# Patient Record
Sex: Female | Born: 1975 | Race: Asian | Hispanic: No | Marital: Single | Smoking: Never smoker
Health system: Southern US, Community
[De-identification: ages and names within clinical notes are randomized; demographics above are authoritative.]

## PROBLEM LIST (undated history)

## (undated) DIAGNOSIS — G43909 Migraine, unspecified, not intractable, without status migrainosus: Secondary | ICD-10-CM

## (undated) HISTORY — DX: Migraine, unspecified, not intractable, without status migrainosus: G43.909

---

## 2006-03-19 ENCOUNTER — Ambulatory Visit (HOSPITAL_COMMUNITY): Admission: RE | Admit: 2006-03-19 | Discharge: 2006-03-19 | Payer: Self-pay | Admitting: Obstetrics & Gynecology

## 2006-05-23 ENCOUNTER — Ambulatory Visit: Payer: Self-pay | Admitting: Gynecology

## 2006-07-13 ENCOUNTER — Inpatient Hospital Stay (HOSPITAL_COMMUNITY): Admission: AD | Admit: 2006-07-13 | Discharge: 2006-07-15 | Payer: Self-pay | Admitting: Obstetrics & Gynecology

## 2006-07-30 ENCOUNTER — Emergency Department (HOSPITAL_COMMUNITY): Admission: EM | Admit: 2006-07-30 | Discharge: 2006-07-30 | Payer: Self-pay | Admitting: *Deleted

## 2006-07-31 ENCOUNTER — Ambulatory Visit: Payer: Self-pay | Admitting: Vascular Surgery

## 2006-07-31 ENCOUNTER — Ambulatory Visit (HOSPITAL_COMMUNITY): Admission: RE | Admit: 2006-07-31 | Discharge: 2006-07-31 | Payer: Self-pay | Admitting: *Deleted

## 2010-01-06 ENCOUNTER — Emergency Department (HOSPITAL_COMMUNITY): Admission: EM | Admit: 2010-01-06 | Discharge: 2010-01-06 | Payer: Self-pay | Admitting: Emergency Medicine

## 2010-08-02 ENCOUNTER — Other Ambulatory Visit: Payer: Self-pay | Admitting: Family Medicine

## 2010-08-02 DIAGNOSIS — Z3689 Encounter for other specified antenatal screening: Secondary | ICD-10-CM

## 2010-08-07 ENCOUNTER — Encounter (HOSPITAL_COMMUNITY): Payer: Self-pay

## 2010-08-07 ENCOUNTER — Ambulatory Visit (HOSPITAL_COMMUNITY)
Admission: RE | Admit: 2010-08-07 | Discharge: 2010-08-07 | Disposition: A | Discharge status: Home / Self Care | Payer: Medicaid Other | Source: Ambulatory Visit | Attending: Family Medicine | Admitting: Family Medicine

## 2010-08-07 DIAGNOSIS — O358XX0 Maternal care for other (suspected) fetal abnormality and damage, not applicable or unspecified: Secondary | ICD-10-CM | POA: Insufficient documentation

## 2010-08-07 DIAGNOSIS — Z3689 Encounter for other specified antenatal screening: Secondary | ICD-10-CM

## 2010-08-07 DIAGNOSIS — O0930 Supervision of pregnancy with insufficient antenatal care, unspecified trimester: Secondary | ICD-10-CM | POA: Insufficient documentation

## 2010-08-07 DIAGNOSIS — Z1389 Encounter for screening for other disorder: Secondary | ICD-10-CM | POA: Insufficient documentation

## 2010-08-07 DIAGNOSIS — Z363 Encounter for antenatal screening for malformations: Secondary | ICD-10-CM | POA: Insufficient documentation

## 2010-09-02 LAB — POCT I-STAT, CHEM 8
BUN: 13 mg/dL (ref 6–23)
Calcium, Ion: 1.1 mmol/L — ABNORMAL LOW (ref 1.12–1.32)
Creatinine, Ser: 0.8 mg/dL (ref 0.4–1.2)
Glucose, Bld: 80 mg/dL (ref 70–99)
TCO2: 27 mmol/L (ref 0–100)

## 2010-11-23 ENCOUNTER — Inpatient Hospital Stay (HOSPITAL_COMMUNITY)
Admission: AD | Admit: 2010-11-23 | Discharge: 2010-11-25 | DRG: 775 | Disposition: A | Discharge status: Home / Self Care | Payer: 59 | Source: Ambulatory Visit | Attending: Obstetrics & Gynecology | Admitting: Obstetrics & Gynecology

## 2010-11-23 LAB — CBC
MCH: 27.7 pg (ref 26.0–34.0)
MCV: 88.4 fL (ref 78.0–100.0)
Platelets: 232 10*3/uL (ref 150–400)
RBC: 3.79 MIL/uL — ABNORMAL LOW (ref 3.87–5.11)
RDW: 15.3 % (ref 11.5–15.5)

## 2011-03-03 ENCOUNTER — Ambulatory Visit (INDEPENDENT_AMBULATORY_CARE_PROVIDER_SITE_OTHER): Payer: Medicaid Other

## 2011-03-03 ENCOUNTER — Inpatient Hospital Stay (INDEPENDENT_AMBULATORY_CARE_PROVIDER_SITE_OTHER)
Admission: RE | Admit: 2011-03-03 | Discharge: 2011-03-03 | Disposition: A | Discharge status: Home / Self Care | Payer: Medicaid Other | Source: Ambulatory Visit | Attending: Family Medicine | Admitting: Family Medicine

## 2011-03-03 DIAGNOSIS — K5900 Constipation, unspecified: Secondary | ICD-10-CM

## 2012-12-17 IMAGING — CR DG ABDOMEN 2V
2 series · 2 of 2 positions shown · non-contrast
Comparison: None

CLINICAL DATA: Abdominal pain and constipation for 3 months.  3
months postpartum.

ABDOMEN - 2 VIEW

[view not recorded (1 of 2)]
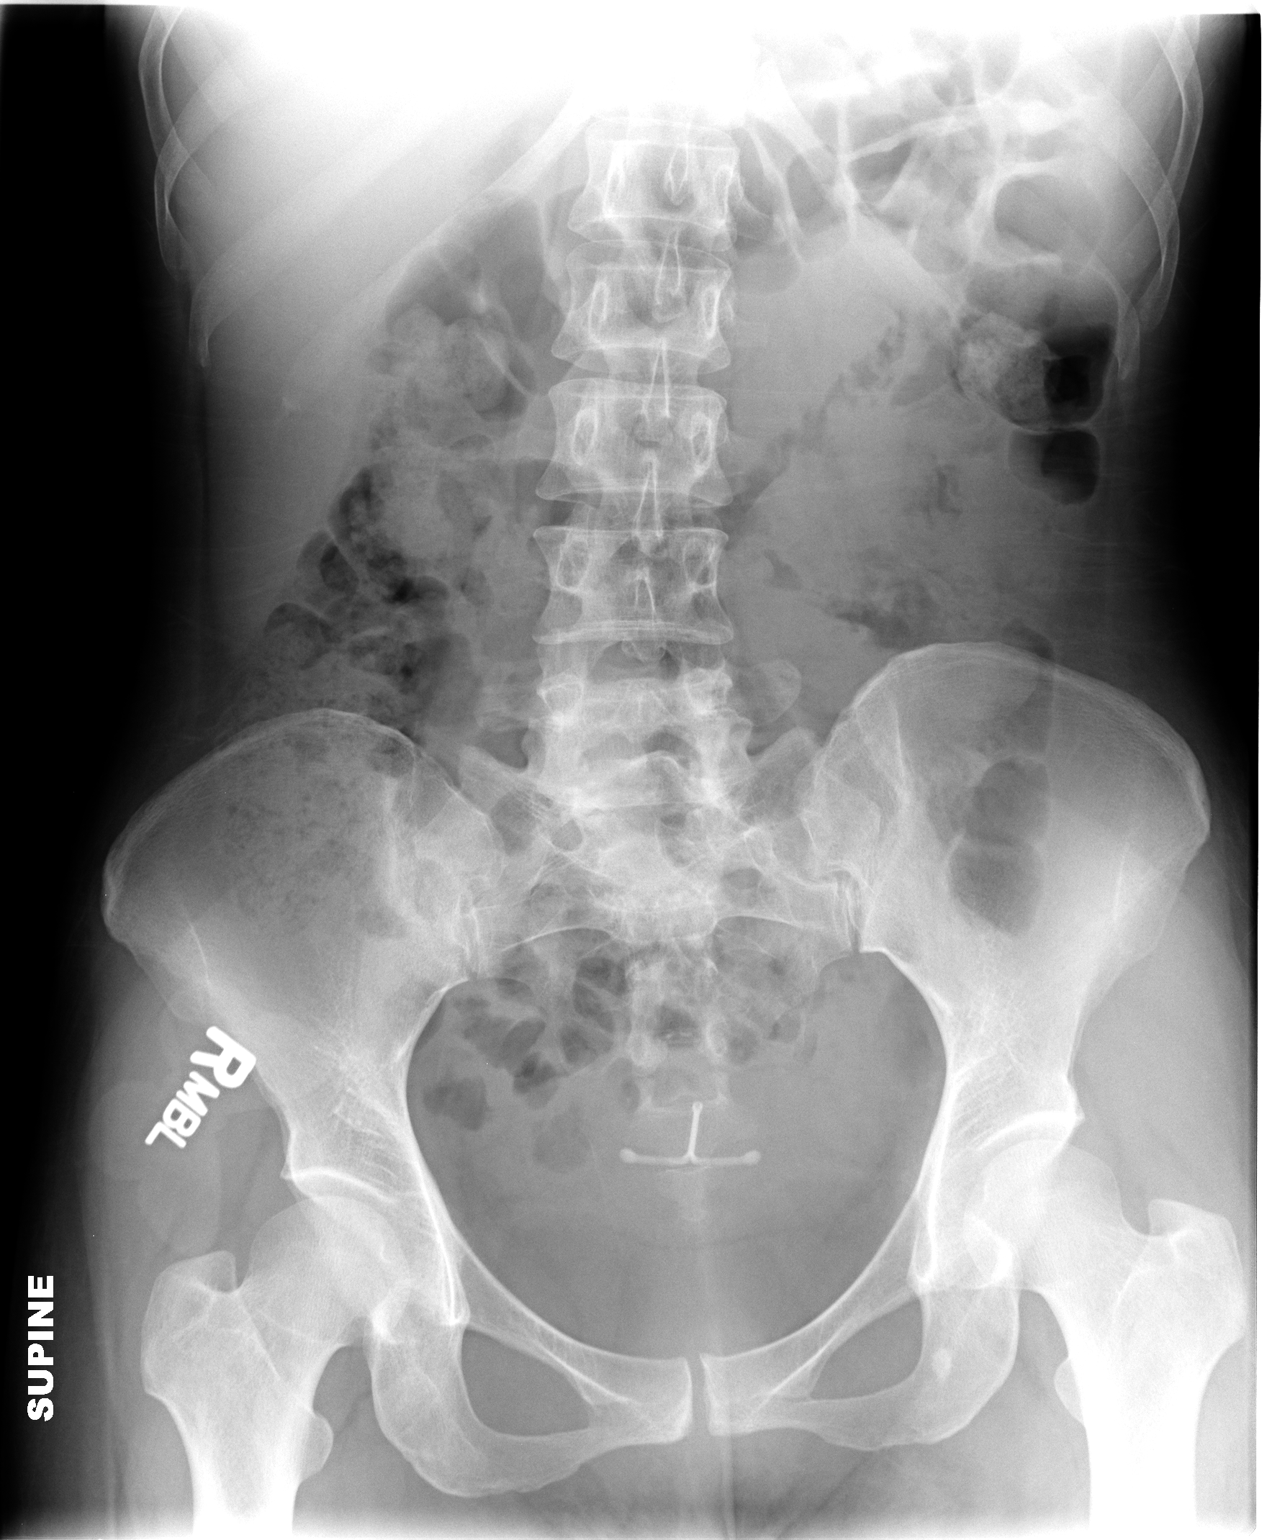

[view not recorded (2 of 2)]
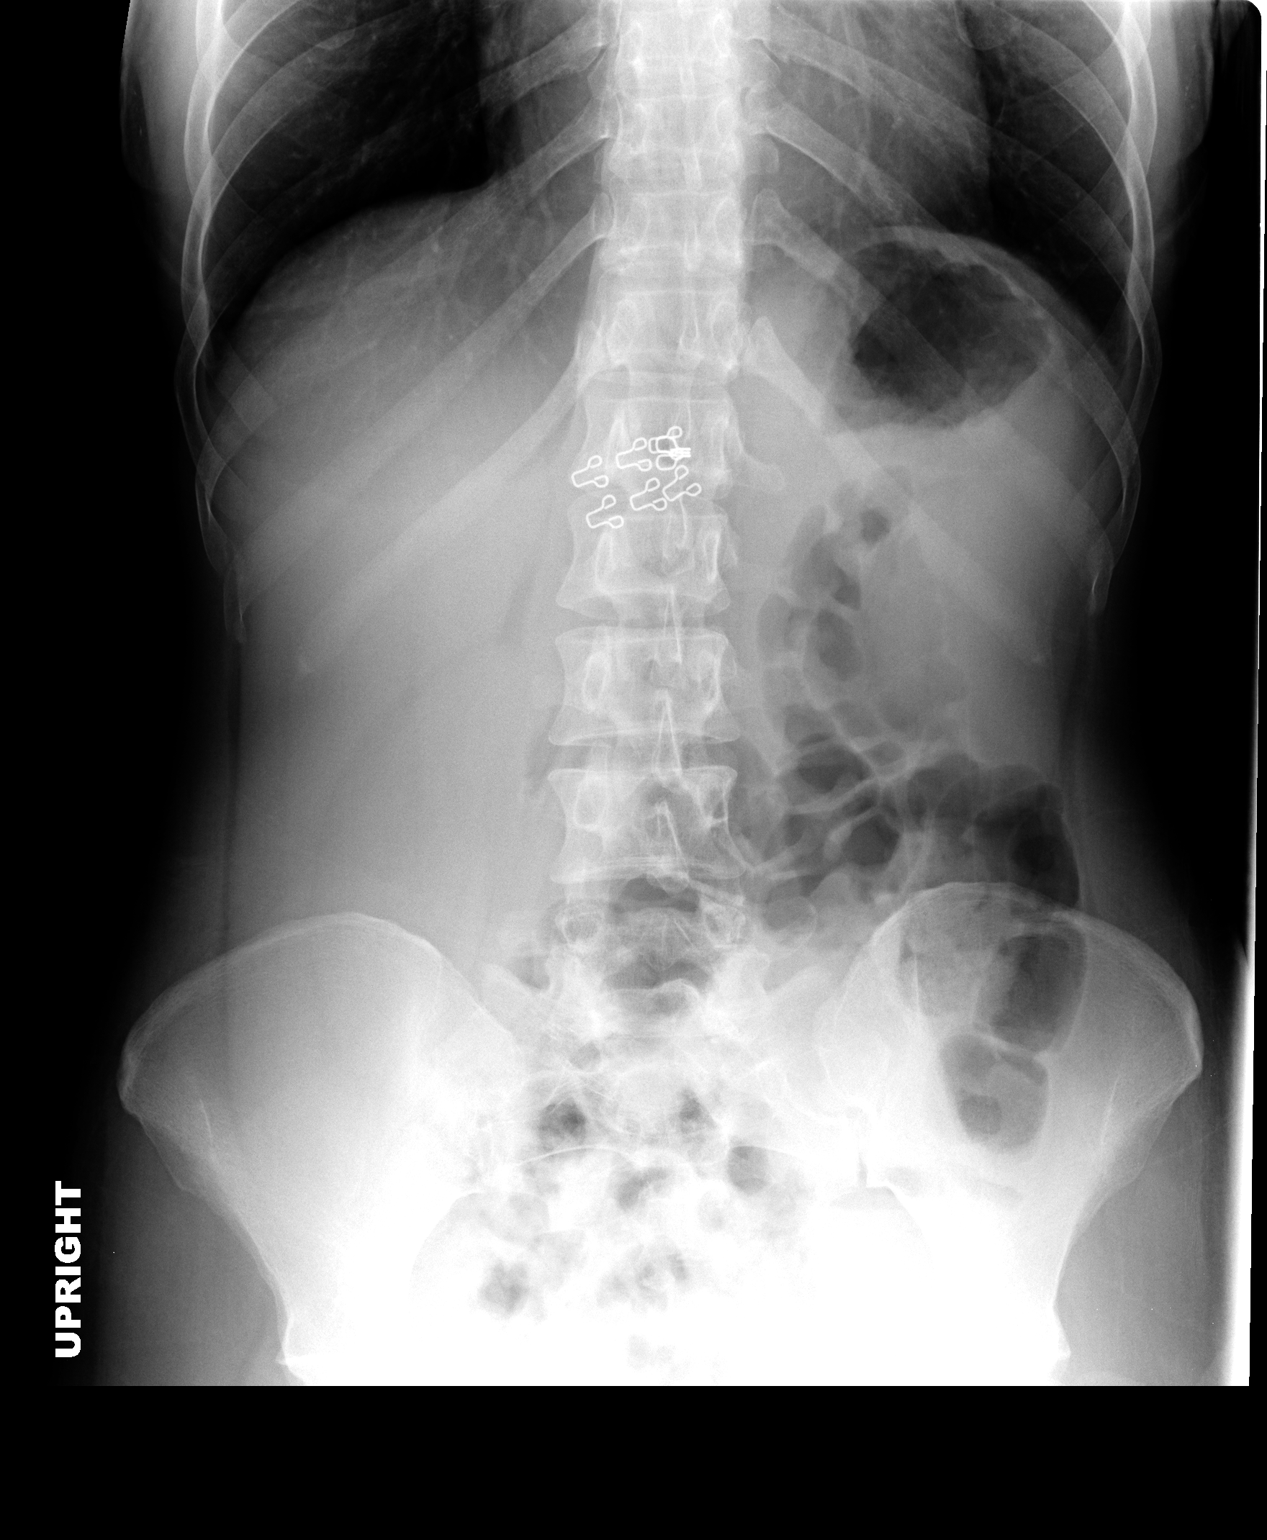

[2 of 2 positions shown; findings below may reference images not displayed]

FINDINGS: Bowel gas pattern is nonobstructive.  An intrauterine
device overlies the central pelvis.  No evidence for organomegaly
or abnormal calcifications. Visualized osseous structures have a
normal appearance.  Lung bases are clear.  No evidence for free
intraperitoneal air beneath the diaphragm. There is moderate stool
within the ascending colon, within nondilated loops.
IMPRESSION: 1.  Moderate stool burden.
2. No evidence for acute  abnormality.

## 2014-04-19 ENCOUNTER — Encounter (HOSPITAL_COMMUNITY): Payer: Self-pay

## 2015-03-31 DIAGNOSIS — Z598 Other problems related to housing and economic circumstances: Secondary | ICD-10-CM

## 2015-03-31 DIAGNOSIS — Z5989 Other problems related to housing and economic circumstances: Secondary | ICD-10-CM

## 2015-03-31 NOTE — Congregational Nurse Program (Signed)
Assisted patient in completing new Medicaid application.

## 2015-06-15 DIAGNOSIS — Z719 Counseling, unspecified: Secondary | ICD-10-CM

## 2015-06-16 NOTE — Congregational Nurse Program (Signed)
Congregational Nurse Program Note  Date of Encounter: 06/15/2015  Past Medical History: No past medical history on file.  Encounter Details:     CNP Questionnaire - 06/15/15 1713    Patient Demographics   Is this a new or existing patient? Existing   Patient is considered a/an Refugee   Race Asian   Patient Assistance   Location of Patient Assistance Not Applicable   Patient's financial/insurance status Low Income;Medicaid  Family planning Medicaid only   Uninsured Patient No   Patient referred to apply for the following financial assistance Not Applicable   Food insecurities addressed Not Applicable   Transportation assistance No   Assistance securing medications No   Educational health offerings Navigating the healthcare system   Encounter Details   Primary purpose of visit Navigating the Healthcare System   Was an Emergency Department visit averted? Not Applicable   Does patient have a medical provider? No   Patient referred to Establish PCP   Was a mental health screening completed? (GAINS tool) No   Does patient have dental issues? No   Since previous encounter, have you referred patient for abnormal blood pressure that resulted in a new diagnosis or medication change? No   Since previous encounter, have you referred patient for abnormal blood glucose that resulted in a new diagnosis or medication change? No   For Abstraction Use Only   Does patient have insurance? Yes       Patient received new Medicaid card but uncertain what type coverage she has.  CN called DSS and determined [facility]  [facility]  Patient has no medical home.  Message left [facility] to schedule [facility].

## 2015-06-22 ENCOUNTER — Telehealth: Payer: Self-pay

## 2015-06-22 NOTE — Telephone Encounter (Signed)
Called patient to tell her she has an appointment at [facility] [address] On Jan. 9, 2017 @ 9;30 for eligibility and needs to pick up a packet at the front desk before the appointment.  Also informed her that a request has been put in to DSS to change her Medicaid provider [facility].

## 2017-12-04 ENCOUNTER — Ambulatory Visit (INDEPENDENT_AMBULATORY_CARE_PROVIDER_SITE_OTHER): Payer: Medicaid Other | Admitting: Obstetrics & Gynecology

## 2017-12-04 ENCOUNTER — Encounter: Payer: Self-pay | Admitting: Obstetrics & Gynecology

## 2017-12-04 ENCOUNTER — Other Ambulatory Visit (HOSPITAL_COMMUNITY)
Admission: RE | Admit: 2017-12-04 | Discharge: 2017-12-04 | Disposition: A | Discharge status: Home / Self Care | Payer: Medicaid Other | Source: Ambulatory Visit | Attending: Obstetrics & Gynecology | Admitting: Obstetrics & Gynecology

## 2017-12-04 VITALS — BP 122/80 | HR 96 | Wt 106.1 lb

## 2017-12-04 DIAGNOSIS — Z0000 Encounter for general adult medical examination without abnormal findings: Secondary | ICD-10-CM | POA: Diagnosis not present

## 2017-12-04 DIAGNOSIS — Z23 Encounter for immunization: Secondary | ICD-10-CM | POA: Diagnosis not present

## 2017-12-04 DIAGNOSIS — Z01419 Encounter for gynecological examination (general) (routine) without abnormal findings: Secondary | ICD-10-CM | POA: Diagnosis present

## 2017-12-04 NOTE — Progress Notes (Signed)
[place] interpreter [name]  Mammogram scheduled for July 16th @ 1040.  Pt notified.  Tdap vaccine given

## 2017-12-04 NOTE — Progress Notes (Signed)
Subjective:    Pamela Anderson is a 42 y.o. divorced 4 (2922, 2718, 311, and 677 yo kids) female who presents for an annual exam. The patient has no complaints today. The patient is not currently sexually active. GYN screening history: last pap: was normal. The patient wears seatbelts: yes. The patient participates in regular exercise: yes. Has the patient ever been transfused or tattooed?: no. The patient reports that there is not domestic violence in her life.   Menstrual History: OB History    Gravida  4   Para  4   Term  4   Preterm      AB      Living  4     SAB      TAB      Ectopic      Multiple      Live Births              Menarche age: [age] No LMP recorded. (Menstrual status: IUD).    The following portions of the patient's history were reviewed and updated as appropriate: allergies, current medications, past family history, past medical history, past social history, past surgical history and problem list.  Review of Systems Pertinent items are noted in HPI.   Has had an IUD since 2017, placed at the health dept, doesn't have periods FH-no breast/gyn/colon cancer Live interpretor used for this visit Works at [employer]   Objective:    BP 122/80   Pulse 96   Wt 106 lb 1.6 oz (48.1 kg)   Breastfeeding? Unknown   General Appearance:    Alert, cooperative, no distress, appears stated age  Head:    Normocephalic, without obvious abnormality, atraumatic  Eyes:    PERRL, conjunctiva/corneas clear, EOM's intact, fundi    benign, both eyes  Ears:    Normal TM's and external ear canals, both ears  Nose:   Nares normal, septum midline, mucosa normal, no drainage    or sinus tenderness  Throat:   Lips, mucosa, and tongue normal; teeth and gums normal  Neck:   Supple, symmetrical, trachea midline, no adenopathy;    thyroid:  no enlargement/tenderness/nodules; no carotid   bruit or JVD  Back:     Symmetric, no curvature, ROM normal, no CVA tenderness  Lungs:     Clear to  auscultation bilaterally, respirations unlabored  Chest Wall:    No tenderness or deformity   Heart:    Regular rate and rhythm, S1 and S2 normal, no murmur, rub   or gallop  Breast Exam:    No tenderness, masses, or nipple abnormality  Abdomen:     Soft, non-tender, bowel sounds active all four quadrants,    no masses, no organomegaly  Genitalia:    Normal female without lesion, discharge or tenderness, normal size and shape, anteverted, mobile, non-tender, normal adnexal exam      Extremities:   Extremities normal, atraumatic, no cyanosis or edema  Pulses:   2+ and symmetric all extremities  Skin:   Skin color, texture, turgor normal, no rashes or lesions  Lymph nodes:   Cervical, supraclavicular, and axillary nodes normal  Neurologic:   CNII-XII intact, normal strength, sensation and reflexes    throughout  .    Assessment:    Healthy female exam.    Plan:     Mammogram. Thin prep Pap smear. with cotesting TDAP

## 2017-12-05 LAB — CYTOLOGY - PAP
Diagnosis: NEGATIVE
HPV: NOT DETECTED

## 2017-12-31 ENCOUNTER — Ambulatory Visit
Admission: RE | Admit: 2017-12-31 | Discharge: 2017-12-31 | Disposition: A | Discharge status: Home / Self Care | Payer: Medicaid Other | Source: Ambulatory Visit | Attending: Obstetrics & Gynecology | Admitting: Obstetrics & Gynecology

## 2017-12-31 ENCOUNTER — Other Ambulatory Visit (HOSPITAL_COMMUNITY): Payer: Self-pay | Admitting: Obstetrics & Gynecology

## 2017-12-31 ENCOUNTER — Other Ambulatory Visit: Payer: Self-pay | Admitting: Obstetrics & Gynecology

## 2017-12-31 DIAGNOSIS — Z1231 Encounter for screening mammogram for malignant neoplasm of breast: Secondary | ICD-10-CM

## 2017-12-31 DIAGNOSIS — Z01419 Encounter for gynecological examination (general) (routine) without abnormal findings: Secondary | ICD-10-CM

## 2018-05-22 ENCOUNTER — Encounter (HOSPITAL_COMMUNITY): Payer: Self-pay

## 2018-05-22 ENCOUNTER — Ambulatory Visit (HOSPITAL_COMMUNITY)
Admission: EM | Admit: 2018-05-22 | Discharge: 2018-05-22 | Disposition: A | Discharge status: Home / Self Care | Payer: Self-pay | Attending: Family Medicine | Admitting: Family Medicine

## 2018-05-22 DIAGNOSIS — J069 Acute upper respiratory infection, unspecified: Secondary | ICD-10-CM

## 2018-05-22 DIAGNOSIS — B9789 Other viral agents as the cause of diseases classified elsewhere: Secondary | ICD-10-CM

## 2018-05-22 MED ORDER — IBUPROFEN 600 MG PO TABS: 600.0000 mg | ORAL_TABLET | Freq: Four times a day (QID) | ORAL | 0 refills | Status: DC | PRN

## 2018-05-22 MED ORDER — CETIRIZINE HCL 10 MG PO CAPS: 10.0000 mg | ORAL_CAPSULE | Freq: Every day | ORAL | 0 refills | Status: DC

## 2018-05-22 MED ORDER — FLUTICASONE PROPIONATE 50 MCG/ACT NA SUSP: 1.0000 | Freq: Every day | NASAL | 0 refills | Status: DC

## 2018-05-22 MED ORDER — LIDOCAINE VISCOUS HCL 2 % MT SOLN: 15.0000 mL | OROMUCOSAL | 0 refills | Status: DC | PRN

## 2018-05-22 MED ORDER — PSEUDOEPH-BROMPHEN-DM 30-2-10 MG/5ML PO SYRP: 5.0000 mL | ORAL_SOLUTION | Freq: Four times a day (QID) | ORAL | 0 refills | Status: DC | PRN

## 2018-05-22 NOTE — ED Provider Notes (Signed)
[facility]    CSN: [id number] Arrival date & time: 05/22/18  4098     History   Chief Complaint Chief Complaint  Patient presents with  . Cough    HPI Patient speaks specific dialect of [place] no available on stratus interpreter; family member here for assistance Pamela Anderson is a 42 y.o. female no significant past medical history presenting today for evaluation of URI symptoms.  Patient has had cough, congestion, rhinorrhea and sore throat.  Patient is also had some mild shortness of breath, fatigue and dizziness.  Low-grade fevers of 100.5.  Symptoms have been going on for approximately 3 days.  She is taking some Tylenol but no other over-the-counter medicines for her symptoms.  Denies nausea, vomiting, abdominal pain or diarrhea.  Denies history of asthma or smoking.  HPI  History reviewed. No pertinent past medical history.  There are no active problems to display for this patient.   History reviewed. No pertinent surgical history.  OB History    Gravida  4   Para  4   Term  4   Preterm      AB      Living  4     SAB      TAB      Ectopic      Multiple      Live Births               Home Medications    Prior to Admission medications   Medication Sig Start Date End Date Taking? Authorizing Provider  brompheniramine-pseudoephedrine-DM 30-2-10 MG/5ML syrup Take 5 mLs by mouth 4 (four) times daily as needed. 05/22/18   Shereece Wellborn C, PA-C  Cetirizine HCl 10 MG CAPS Take 1 capsule (10 mg total) by mouth daily for 10 days. 05/22/18 06/01/18  Janeen Watson C, PA-C  fluticasone (FLONASE) 50 MCG/ACT nasal spray Place 1-2 sprays into both nostrils daily for 7 days. 05/22/18 05/29/18  Anja Neuzil C, PA-C  ibuprofen (ADVIL,MOTRIN) 600 MG tablet Take 1 tablet (600 mg total) by mouth every 6 (six) hours as needed. 05/22/18   Amaziah Raisanen C, PA-C  lidocaine (XYLOCAINE) 2 % solution Use as directed 15 mLs in the mouth or throat every 4  (four) hours as needed for mouth pain. 05/22/18   Myeshia Fojtik C, PA-C  Multiple Vitamins-Minerals (MULTIVITAMIN ADULT) CHEW Chew 1 tablet by mouth daily.    [provider]    Family History No family history on file.  Social History Social History   Tobacco Use  . Smoking status: Never Smoker  . Smokeless tobacco: Never Used  Substance Use Topics  . Alcohol use: Never    Frequency: Never  . Drug use: Never     Allergies   Patient has no known allergies.   Review of Systems Review of Systems  Constitutional: Positive for fatigue. Negative for activity change, appetite change, chills and fever.  HENT: Positive for congestion, rhinorrhea and sore throat. Negative for ear pain, sinus pressure and trouble swallowing.   Eyes: Negative for discharge and redness.  Respiratory: Positive for cough. Negative for chest tightness and shortness of breath.   Cardiovascular: Negative for chest pain.  Gastrointestinal: Negative for abdominal pain, diarrhea, nausea and vomiting.  Musculoskeletal: Negative for myalgias.  Skin: Negative for rash.  Neurological: Positive for light-headedness. Negative for dizziness and headaches.     Physical Exam Triage Vital Signs ED Triage Vitals  Enc Vitals Group     BP  05/22/18 1045 118/83     Pulse Rate 05/22/18 1045 84     Resp 05/22/18 1045 18     Temp 05/22/18 1045 98.4 F (36.9 C)     Temp Source 05/22/18 1045 Oral     SpO2 05/22/18 1045 100 %     Weight --      Height --      Head Circumference --      Peak Flow --      Pain Score 05/22/18 1124 0     Pain Loc --      Pain Edu? --      Excl. in GC? --    No data found.  Updated Vital Signs BP 118/83 (BP Location: Right Arm)   Pulse 84   Temp 98.4 F (36.9 C) (Oral)   Resp 18   SpO2 100%   Visual Acuity Right Eye Distance:   Left Eye Distance:   Bilateral Distance:    Right Eye Near:   Left Eye Near:    Bilateral Near:     Physical Exam  Constitutional:  She appears well-developed and well-nourished. No distress.  HENT:  Head: Normocephalic and atraumatic.  Bilateral ears without tenderness to palpation of external auricle, tragus and mastoid, EAC's without erythema or swelling, TM's with good bony landmarks and cone of light. Non erythematous. Swollen turbinates Oral mucosa pink and moist, no tonsillar enlargement or exudate. Posterior pharynx patent and nonerythematous, no uvula deviation or swelling. Normal phonation.  Geographic tongue present  Eyes: Conjunctivae are normal.  Neck: Neck supple.  Cardiovascular: Normal rate and regular rhythm.  No murmur heard. Pulmonary/Chest: Effort normal and breath sounds normal. No respiratory distress.  Breathing comfortably at rest, CTABL, no wheezing, rales or other adventitious sounds auscultated  Abdominal: Soft. There is no tenderness.  Musculoskeletal: She exhibits no edema.  Neurological: She is alert.  Skin: Skin is warm and dry.  Psychiatric: She has a normal mood and affect.  Nursing note and vitals reviewed.    UC Treatments / Results  Labs (all labs ordered are listed, but only abnormal results are displayed) Labs Reviewed - No data to display  EKG None  Radiology No results found.  Procedures Procedures (including critical care time)  Medications Ordered in UC Medications - No data to display  Initial Impression / Assessment and Plan / UC Course  I have reviewed the triage vital signs and the nursing notes.  Pertinent labs & imaging results that were available during my care of the patient were reviewed by me and considered in my medical decision making (see chart for details).     URI symptoms x3 days, vital signs stable in clinic, exam relatively unremarkable.  Most likely viral etiology, will recommend treatment with supportive and symptomatic management.  Recommendations below.  Discussed typical course of viral illnesses, continue to monitor temperature,  breathing, symptoms,Discussed strict return precautions. Patient verbalized understanding and is agreeable with plan.  Final Clinical Impressions(s) / UC Diagnoses   Final diagnoses:  Viral URI with cough     Discharge Instructions     Your symptoms are most likely viral and should resolve over the next 5 to 7 days. Please begin taking daily Zyrtec/cetirizine to help with congestion and drainage Please also use Flonase nasal spray-1 to 2 sprays in each nostril to help with congestion Please use cough syrup as needed for cough Use lidocaine swish in mouth to help with discomfort from tongue, and swallow, use before meals and  bedtime Use ibuprofen and Tylenol as needed for body aches and headache  Please follow-up if symptoms not resolving as expected, worsening, developing worsening cough, shortness of breath or difficulty breathing, persistent fevers, new symptoms   ED Prescriptions    Medication Sig Dispense Auth. Provider   Cetirizine HCl 10 MG CAPS Take 1 capsule (10 mg total) by mouth daily for 10 days. 10 capsule Loucille Takach C, PA-C   fluticasone (FLONASE) 50 MCG/ACT nasal spray Place 1-2 sprays into both nostrils daily for 7 days. 1 g Hayzlee Mcsorley C, PA-C   brompheniramine-pseudoephedrine-DM 30-2-10 MG/5ML syrup Take 5 mLs by mouth 4 (four) times daily as needed. 120 mL Maleny Candy C, PA-C   ibuprofen (ADVIL,MOTRIN) 600 MG tablet Take 1 tablet (600 mg total) by mouth every 6 (six) hours as needed. 30 tablet Edward Guthmiller C, PA-C   lidocaine (XYLOCAINE) 2 % solution Use as directed 15 mLs in the mouth or throat every 4 (four) hours as needed for mouth pain. 100 mL Goro Wenrick C, PA-C     Controlled Substance Prescriptions [place] Controlled Substance Registry consulted? Not Applicable   Lew Dawes, [place] 05/23/18 1324

## 2018-05-22 NOTE — Discharge Instructions (Signed)
Your symptoms are most likely viral and should resolve over the next 5 to 7 days. Please begin taking daily Zyrtec/cetirizine to help with congestion and drainage Please also use Flonase nasal spray-1 to 2 sprays in each nostril to help with congestion Please use cough syrup as needed for cough Use lidocaine swish in mouth to help with discomfort from tongue, and swallow, use before meals and bedtime Use ibuprofen and Tylenol as needed for body aches and headache  Please follow-up if symptoms not resolving as expected, worsening, developing worsening cough, shortness of breath or difficulty breathing, persistent fevers, new symptoms

## 2018-05-22 NOTE — ED Notes (Signed)
Pt non english speaking, son states pt c/o flu symptoms x3 days, productive cough and sinus pressure

## 2018-09-11 ENCOUNTER — Ambulatory Visit (HOSPITAL_COMMUNITY)
Admission: EM | Admit: 2018-09-11 | Discharge: 2018-09-11 | Disposition: A | Discharge status: Home / Self Care | Payer: BLUE CROSS/BLUE SHIELD | Attending: Internal Medicine | Admitting: Internal Medicine

## 2018-09-11 ENCOUNTER — Encounter (HOSPITAL_COMMUNITY): Payer: Self-pay | Admitting: Emergency Medicine

## 2018-09-11 DIAGNOSIS — F321 Major depressive disorder, single episode, moderate: Secondary | ICD-10-CM | POA: Diagnosis not present

## 2018-09-11 DIAGNOSIS — R1013 Epigastric pain: Secondary | ICD-10-CM | POA: Diagnosis not present

## 2018-09-11 DIAGNOSIS — G43109 Migraine with aura, not intractable, without status migrainosus: Secondary | ICD-10-CM

## 2018-09-11 MED ORDER — SERTRALINE HCL 50 MG PO TABS: 50.0000 mg | ORAL_TABLET | Freq: Every day | ORAL | 2 refills | Status: DC

## 2018-09-11 MED ORDER — SUMATRIPTAN SUCCINATE 25 MG PO TABS: ORAL_TABLET | ORAL | 0 refills | Status: DC

## 2018-09-11 NOTE — ED Triage Notes (Signed)
Pt c/o headache x2 days with stomachache x2 days as well, states she took a tylenol earlier today.

## 2018-09-11 NOTE — ED Provider Notes (Signed)
[facility]    CSN: [id number] Arrival date & time: 09/11/18  1545     History   Chief Complaint Chief Complaint  Patient presents with  . Headache  . Abdominal Pain    HPI KAMAREA FELAND is a 43 y.o. female with no past medical history comes to urgent care with complaints of abdominal pain and a headache.  Abdominal pain is mainly epigastric with no relieving or aggravating factors.  The headache is global, severe and aggravated by bright light/noise and relieved in the dark quiet place.  It is associated with noncolored floaters.  Patient also complains about depressed mood, sadness, anhedonia, insomnia.  She denies any suicidal or homicidal ideation.  Patient lost her son through suicide about 6 weeks ago.  HPI  History reviewed. No pertinent past medical history.  There are no active problems to display for this patient.   History reviewed. No pertinent surgical history.  OB History    Gravida  4   Para  4   Term  4   Preterm      AB      Living  4     SAB      TAB      Ectopic      Multiple      Live Births               Home Medications    Prior to Admission medications   Medication Sig Start Date End Date Taking? Authorizing Provider  brompheniramine-pseudoephedrine-DM 30-2-10 MG/5ML syrup Take 5 mLs by mouth 4 (four) times daily as needed. 05/22/18   Wieters, Hallie C, PA-C  Cetirizine HCl 10 MG CAPS Take 1 capsule (10 mg total) by mouth daily for 10 days. 05/22/18 06/01/18  Wieters, Hallie C, PA-C  fluticasone (FLONASE) 50 MCG/ACT nasal spray Place 1-2 sprays into both nostrils daily for 7 days. 05/22/18 05/29/18  Wieters, Hallie C, PA-C  ibuprofen (ADVIL,MOTRIN) 600 MG tablet Take 1 tablet (600 mg total) by mouth every 6 (six) hours as needed. 05/22/18   Wieters, Hallie C, PA-C  lidocaine (XYLOCAINE) 2 % solution Use as directed 15 mLs in the mouth or throat every 4 (four) hours as needed for mouth pain. 05/22/18   Wieters, Hallie C, PA-C   Multiple Vitamins-Minerals (MULTIVITAMIN ADULT) CHEW Chew 1 tablet by mouth daily.    [provider]    Family History No family history on file.  Social History Social History   Tobacco Use  . Smoking status: Never Smoker  . Smokeless tobacco: Never Used  Substance Use Topics  . Alcohol use: Never    Frequency: Never  . Drug use: Never     Allergies   Patient has no known allergies.   Review of Systems Review of Systems  Constitutional: Negative for activity change, appetite change, chills, fatigue and fever.  HENT: Negative for congestion, ear discharge, ear pain, postnasal drip, sinus pressure and sinus pain.   Gastrointestinal: Negative for abdominal distention, abdominal pain, constipation, diarrhea and vomiting.  Endocrine: Negative.   Genitourinary: Negative for dysuria, frequency and urgency.  Musculoskeletal: Negative for arthralgias, back pain and joint swelling.  Neurological: Positive for headaches. Negative for dizziness, syncope, weakness and numbness.  Psychiatric/Behavioral: Negative for agitation, confusion and decreased concentration.     Physical Exam Triage Vital Signs ED Triage Vitals [09/11/18 1556]  Enc Vitals Group     BP 120/77     Pulse Rate (!) 105  Resp 18     Temp 98.5 F (36.9 C)     Temp src      SpO2 99 %     Weight      Height      Head Circumference      Peak Flow      Pain Score      Pain Loc      Pain Edu?      Excl. in GC?    No data found.  Updated Vital Signs BP 120/77   Pulse (!) 105   Temp 98.5 F (36.9 C)   Resp 18   SpO2 99%   Visual Acuity Right Eye Distance:   Left Eye Distance:   Bilateral Distance:    Right Eye Near:   Left Eye Near:    Bilateral Near:     Physical Exam Constitutional:      Appearance: She is not ill-appearing or toxic-appearing.  HENT:     Mouth/Throat:     Mouth: Mucous membranes are moist.  Eyes:     General: No visual field deficit.    Extraocular  Movements: Extraocular movements intact.     Right eye: No nystagmus.  Neck:     Musculoskeletal: Normal range of motion.  Cardiovascular:     Rate and Rhythm: Normal rate and regular rhythm.     Heart sounds: Normal heart sounds. No murmur. No friction rub. No gallop.   Pulmonary:     Effort: Pulmonary effort is normal.     Breath sounds: Normal breath sounds.  Abdominal:     General: Bowel sounds are normal. There is no distension.     Palpations: Abdomen is soft.     Tenderness: There is no abdominal tenderness.  Musculoskeletal: Normal range of motion.  Skin:    General: Skin is warm.     Capillary Refill: Capillary refill takes less than 2 seconds.     Findings: No erythema or rash.  Neurological:     Mental Status: She is alert and oriented to person, place, and time.     GCS: GCS eye subscore is 4. GCS verbal subscore is 5. GCS motor subscore is 6.     Cranial Nerves: No cranial nerve deficit, dysarthria or facial asymmetry.     Sensory: No sensory deficit.     Motor: No weakness.     Coordination: Coordination normal.     Deep Tendon Reflexes: Reflexes normal.  Psychiatric:        Mood and Affect: Mood is depressed.        Cognition and Memory: Cognition is not impaired.      UC Treatments / Results  Labs (all labs ordered are listed, but only abnormal results are displayed) Labs Reviewed - No data to display  EKG None  Radiology No results found.  Procedures Procedures (including critical care time)  Medications Ordered in UC Medications - No data to display  Initial Impression / Assessment and Plan / UC Course  I have reviewed the triage vital signs and the nursing notes.  Pertinent labs & imaging results that were available during my care of the patient were reviewed by me and considered in my medical decision making (see chart for details).     1.  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  Migraine headaches with aura: Imitrex as needed for headaches  Migraine headaches/abdominal pain is likely precipitated by major depressive disorder.  Final Clinical Impressions(s) / UC Diagnoses   Final diagnoses:  None   Discharge Instructions   None    ED Prescriptions    None     Controlled Substance Prescriptions [place] Controlled Substance Registry consulted? No   Merrilee Jansky, MD 09/11/18 [phone]

## 2018-12-10 ENCOUNTER — Other Ambulatory Visit: Payer: Self-pay | Admitting: Pediatric Intensive Care

## 2018-12-10 ENCOUNTER — Ambulatory Visit: Payer: BLUE CROSS/BLUE SHIELD | Admitting: Family Medicine

## 2018-12-10 DIAGNOSIS — Z20822 Contact with and (suspected) exposure to covid-19: Secondary | ICD-10-CM

## 2018-12-14 LAB — NOVEL CORONAVIRUS, NAA: SARS-CoV-2, NAA: NOT DETECTED

## 2018-12-15 ENCOUNTER — Ambulatory Visit (INDEPENDENT_AMBULATORY_CARE_PROVIDER_SITE_OTHER): Payer: BLUE CROSS/BLUE SHIELD | Admitting: Family Medicine

## 2018-12-15 ENCOUNTER — Other Ambulatory Visit: Payer: Self-pay

## 2018-12-15 ENCOUNTER — Encounter: Payer: Self-pay | Admitting: Family Medicine

## 2018-12-15 VITALS — BP 110/70 | HR 93 | Temp 99.3°F | Resp 12 | Ht 62.0 in | Wt 108.0 lb

## 2018-12-15 DIAGNOSIS — F324 Major depressive disorder, single episode, in partial remission: Secondary | ICD-10-CM | POA: Diagnosis not present

## 2018-12-15 DIAGNOSIS — G4700 Insomnia, unspecified: Secondary | ICD-10-CM

## 2018-12-15 DIAGNOSIS — R51 Headache: Secondary | ICD-10-CM | POA: Diagnosis not present

## 2018-12-15 DIAGNOSIS — R519 Headache, unspecified: Secondary | ICD-10-CM

## 2018-12-15 DIAGNOSIS — J309 Allergic rhinitis, unspecified: Secondary | ICD-10-CM | POA: Diagnosis not present

## 2018-12-15 MED ORDER — FLUTICASONE PROPIONATE 50 MCG/ACT NA SUSP: 1.0000 | Freq: Every day | NASAL | 2 refills | Status: AC

## 2018-12-15 MED ORDER — SUMATRIPTAN SUCCINATE 25 MG PO TABS: ORAL_TABLET | ORAL | 0 refills | Status: AC

## 2018-12-15 MED ORDER — AMITRIPTYLINE HCL 10 MG PO TABS: 10.0000 mg | ORAL_TABLET | Freq: Every day | ORAL | 1 refills | Status: DC

## 2018-12-15 NOTE — Progress Notes (Signed)
HPI:   Pamela Anderson is a 43 y.o. female, who is here today to establish care.  Former PCP: N/A Last preventive routine visit: N/A Last routine gyn exam was 12/04/2017.  A translator was present during her visit. She speaks [place].  Chronic medical problems: Depression, headache, and allergies.   Concerns today: Medication refills. 5 months of parietal throbbing headache.No associated visual changes,nausea,vomiting, or focal weakness.  No prior Hx of headache. She is taking Imitrex daily to prevent headaches.. She gets 3-4 times. Exacerbated  By lack of sleep. She has not slept for 2 days and started with headache today.  Depression, Dx about 5 months ago after one of her sons died. She was started on Sertraline 50 mg. Medication has helped. Denies side effects. No suicidal thoughts.  Allergic rhinorrhea,she is on Flonase nasal spray and Cetirizine 10 mg daily. + Rhinorrhea and nasal congestion,intermittent. Exacerbated by weather changes. Negative for fever,chills,body aches,cough,wheezing,or dyspnea.   Review of Systems  Constitutional: Negative for activity change, appetite change and unexpected weight change.  HENT: Positive for mouth sores. Negative for sore throat.   Eyes: Negative for pain and discharge.  Gastrointestinal: Negative for abdominal pain and blood in stool.  Endocrine: Negative for cold intolerance and heat intolerance.  Genitourinary: Negative for decreased urine volume, dysuria and hematuria.  Musculoskeletal: Negative for gait problem and myalgias.  Skin: Negative for rash.  Allergic/Immunologic: Positive for environmental allergies.  Neurological: Negative for facial asymmetry, speech difficulty and weakness.  Psychiatric/Behavioral: Negative for confusion. The patient is not nervous/anxious.   Rest see pertinent positives and negatives per HPI.   Current Outpatient Medications on File Prior to Visit  Medication Sig Dispense  Refill  . sertraline (ZOLOFT) 50 MG tablet Take 1 tablet (50 mg total) by mouth daily for 30 days. 30 tablet 2   No current facility-administered medications on file prior to visit.      Past Medical History:  Diagnosis Date  . Migraine    No Known Allergies  Family History  Problem Relation Age of Onset  . Depression Son     Social History   Socioeconomic History  . Marital status: Married    Spouse name: Not on file  . Number of children: 2  . Years of education: Not on file  . Highest education level: Not on file  Occupational History  . Not on file  Social Needs  . Financial resource strain: Not on file  . Food insecurity    Worry: Not on file    Inability: Not on file  . Transportation needs    Medical: Not on file    Non-medical: Not on file  Tobacco Use  . Smoking status: Never Smoker  . Smokeless tobacco: Never Used  Substance and Sexual Activity  . Alcohol use: Never    Frequency: Never  . Drug use: Never  . Sexual activity: Not Currently    Birth control/protection: I.U.D.  Lifestyle  . Physical activity    Days per week: Not on file    Minutes per session: Not on file  . Stress: Not on file  Relationships  . Social Musicianconnections    Talks on phone: Not on file    Gets together: Not on file    Attends religious service: Not on file    Active member of club or organization: Not on file    Attends meetings of clubs or organizations: Not on file    Relationship status: Not  on file  Other Topics Concern  . Not on file  Social History Narrative  . Not on file    Vitals:   12/15/18 1210  BP: 110/70  Pulse: 93  Resp: 12  Temp: 99.3 F (37.4 C)  SpO2: 99%   Body mass index is 19.75 kg/m.  Physical Exam  Nursing note and vitals reviewed. Constitutional: She is oriented to person, place, and time. She appears well-developed and well-nourished. No distress.  HENT:  Head: Normocephalic and atraumatic.  Mouth/Throat: Oropharynx is clear and  moist and mucous membranes are normal.  Eyes: Pupils are equal, round, and reactive to light. Conjunctivae are normal.  Cardiovascular: Normal rate and regular rhythm.  No murmur heard. Pulses:      Dorsalis pedis pulses are 2+ on the right side and 2+ on the left side.  Respiratory: Effort normal and breath sounds normal. No respiratory distress.  GI: Soft. She exhibits no mass. There is no hepatomegaly. There is no abdominal tenderness.  Musculoskeletal:        General: No edema.     Cervical back: She exhibits tenderness. She exhibits normal range of motion and no bony tenderness.       Back:  Lymphadenopathy:    She has no cervical adenopathy.  Neurological: She is alert and oriented to person, place, and time. She has normal strength. No cranial nerve deficit. Gait normal.  Reflex Scores:      Bicep reflexes are 2+ on the right side and 2+ on the left side.      Patellar reflexes are 2+ on the right side and 2+ on the left side. Skin: Skin is warm. No rash noted. No erythema.  Psychiatric: She has a normal mood and affect.  Well groomed, good eye contact.    ASSESSMENT AND PLAN:  Pamela Anderson was seen today for establish care.  Diagnoses and all orders for this visit:  Insomnia, unspecified type Today amitriptyline 10 mg was recommended at bedtime. Good sleep hygiene. We discussed side effects of medication. Follow-up in 2 months.  Depression, major, single episode, in partial remission ([place]) Problem is well controlled with sertraline 50 mg daily, no changes in dose. Some side effects of medication discussed.  Headache, unspecified headache type ?  Tension headache. Explained that Imitrex is to take as needed instead daily. Because she is having frequent headaches, recommend amitriptyline 10 mg at bedtime. We discussed side effects of both medications. Instructed about warning signs. Follow-up in 2 months with headache diary.  Allergic rhinitis Continue Flonase nasal  spray daily as needed. Also recommend saline nasal irrigations as needed.    Return in about 2 months (around 02/14/2019) for Headache,insomnia.    Betty G. [place], MD  [facility]. [place] office.

## 2018-12-15 NOTE — Assessment & Plan Note (Signed)
Continue Flonase nasal spray daily as needed. Also recommend saline nasal irrigations as needed.

## 2018-12-15 NOTE — Assessment & Plan Note (Signed)
Problem is well controlled with sertraline 50 mg daily, no changes in dose. Some side effects of medication discussed.

## 2018-12-15 NOTE — Patient Instructions (Signed)
A few things to remember from today's visit:   Headache, unspecified headache type - Plan: amitriptyline (ELAVIL) 10 MG tablet, SUMAtriptan (IMITREX) 25 MG tablet  Allergic rhinitis, unspecified seasonality, unspecified trigger - Plan: fluticasone (FLONASE) 50 MCG/ACT nasal spray  Depression, major, single episode, in partial remission ([place])   ?au ??u do c?ng th?ng Tension Headache, Adult ?au ??u do c?ng th?ng l c?m gic ?au, p l?c ho?c nh?c nh?i trong ??u th??ng c?m th?y ? vng trn v hai bn ??u. C?n ?au c th? l m ?, ho?c c th? c?m th?y c?ng t?c (th?t l?i). C hai d?ng ?au ??u do c?ng th?ng:  ?au ??u do c?ng th?ng thnh ??t. D?ng ny l khi cc c?n ?au ??u x?y ra d??i 15 ngy m?t thng.  ?au ??u do c?ng th?ng m?n tnh. D?ng ny l khi cc c?n ?au ??u x?y ra h?n 15 ngy m?t thng trong th?i gian 3 thng. M?t c?n ?au ??u do c?ng th?ng c th? ko di t? 30 pht ??n vi ngy. ?y l d?ng ?au ??u th??ng g?p nh?t. ?au ??u do c?ng th?ng th??ng khng km theo bu?n nn ho?c nn v khng tr?m tr?ng h?n khi ho?t ??ng th? ch?t. Nguyn nhn g gy ra? Khng r nguyn nhn chnh xc gy ra tnh tr?ng ny. ?au ??u do c?ng th?ng th??ng b? kh?i pht do c?ng th?ng, lo u ho?c tr?m c?m. Nh?ng tc nhn gy kh?i pht khc bao g?m:  R??u.  Dng qu nhi?u caffein ho?c cai caffein.  Nhi?m trng ???ng h h?p, ch?ng h?n nh? c?m l?nh, cm ho?c nhi?m trng xoang.  Nh?ng v?n ?? v? nha khoa ho?c nghi?n r?ng.  M?t m?i (m?t).  Gi? ??u v c? c?a qu v? ? cng t? th? trong th?i gian di, ch?ng h?n nh? khi s? d?ng my tnh.  Ht thu?c.  Vim kh?p c?. Cc d?u hi?u ho?c tri?u ch?ng l g? Nh?ng tri?u ch?ng c?a tnh tr?ng ny bao g?m:  M?t c?m gic p l?c ho?c c?ng t?c quanh ??u.  ?au ??u m ?, nh?c nh?i.  C?m th?y ?au ? vng trn v hai bn ??u.  Nh?y c?m ?au ? cc c? ??u, c? v vai. Ch?n ?on tnh tr?ng ny nh? th? no? Tnh tr?ng ny c th? ???c ch?n ?on d?a vo cc tri?u ch?ng, b?nh s? cu?a  quy? vi? v khm th?c th?. N?u cc tri?u ch?ng c?a qu v? n?ng ho?c b?t th??ng, qu v? c th? ???c lm cc ki?m tra hnh ?nh, ch?ng h?n nh? ch?p CT ho?c MRI ??u. Qu v? c?ng c th? ???c ki?m tra th? l?c. Tnh tr?ng ny ???c ?i?u tr? nh? th? no? Tnh tr?ng ny c th? ???c ?i?u tr? b?ng cch thay ??i l?i s?ng v b?ng cc lo?i thu?c gip gi?m nh? tri?u ch?ng. Tun th? nh?ng h??ng d?n ny ? nh: X? tr ?au  Ch? s? d?ng thu?c khng k ??n v thu?c k ??n theo ch? d?n c?a chuyn gia ch?m [place] s?c kh?e.  Khi qu v? b? ?au ??u, hy n?m trong m?t phng t?i, yn t?nh.  N?u ???c ch? d?n, ch??m ? l?nh vo ??u v c?: ? Cho ? l?nh vo ti ni lng. ? ?? kh?n t?m ? gi?a da v ti ch??m. ? Ch??m ? l?nh trong 20 pht, 2-3 l?n m?i ngy.  N?u ???c chi? d?n, ha?y ch???m no?ng va?o ph?n sau c? th??ng xuyn theo chi? d?n cu?a chuyn gia ch?m so?c s??c kho?e. S? d?ng ngu?n nhi?t  m chuyn gia ch?m [place] s?c kh?e khuy?n ngh?, ch?ng h?n nh? ti ch??m nhi?t ?m ho?c ??m ch??m nng. ? ?? kh?n t?m ? gi?a da v ngu?n nhi?t. ? Ch??m nng trong 20-30 pht. ? B? ch??m nng ra n?u da qu v? chuy?n sang mu ?? nh?t. ?i?u ny ??c bi?t quan tr?ng n?u qu v? khng th? c?m th?y ?au, nng, hay l?nh. Qu v? c th? c nguy c? b? b?ng cao h?n. ?n v u?ng  ?n u?ng theo l?ch bnh th??ng.  Gi?i h?n l??ng r??u qu v? u?ng khng qu 1 ly m?i ngy v?i ph? n? khng mang thai v 2 ly m?i ngy v?i nam gi?i. M?t ly t??ng ???ng v?i 12 ao-x? bia, 5 ao-x? r??u vang, ho?c 1 ao-x? r??u m?nh.  U?ng ?? n??c ?? gi? cho n??c ti?u c mu vng nh?t.  Gi?m l??ng u?ng caffein, ho?c d?ng dng caffein. L?i s?ng  Ng? 7-9 ti?ng m?i ?m ho?c ng? ?? th?i gian theo khuy?n ngh? c?a chuyn gia ch?m [place] s?c kh?e.  Lc ng?, ?? t?t c? cc thi?t b? ?i?n t? ra kh?i phng ng?. Thi?t b? ?i?n t? bao g?m my tnh, ?i?n tho?i v my tnh b?ng.  Tm cch ?? qu?n l c?ng th?ng. M?t s? th? c th? gip lm gi?m c?ng th?ng bao g?m: ? T?p luy?n. ? T?p cc bi t?p  th? su. ? Yoga. ? Nghe nh?c. ? Hnh ?nh c tinh th?n tch c?c.  C? g?ng ng?i th?ng l?ng v trnh c?ng cc c?.  Khng s? d?ng b?t k? s?n ph?m no ch?a nicotine ho?c thu?c l, ch?ng ha?n nh? thu?c l d?ng ht v thu?c l ?i?n t?. N?u qu v? c?n gip ?? ?? cai thu?c, hy h?i chuyn gia ch?m [place] s?c kh?e. H??ng d?n chung   Tun th? t?t c? cc l?n khm theo di theo ch? d?n c?a chuyn gia ch?m [place] s?c kh?e. ?i?u ny c vai tr quan tr?ng.  Trnh b?t c? tc nhn no gy ?au ??u. Ghi nh?t k ?au ??u hng ngy ?? gip tm ra ?i?u g c th? gy ra cc c?n ?au ??u. V d?, hy ghi l?i: ? Qu v? ?n v u?ng g. ? Qu v? ? ng? bao lu. ? B?t k? thay ??i no trong ch? ?? ?n ho?c thu?c men c?a qu v?. Hy lin l?c v?i chuyn gia ch?m  s?c kh?e n?u:  C?n ?au ??u c?a qu v? khng ??.  C?n ?au ??u c?a qu v? quay tr? l?i.  Qu v? nh?y c?m v?i m thanh, nh sng, mi do ?au ??u.  Qu v? b? bu?n nn ho?c qu v? nn.  Qu v? b? ?au d? dy. Yu c?u tr? gip ngay l?p t?c n?u:  Qu v? ??t ng?t b? ?au ??u r?t nhi?u km theo b?t k? tnh tr?ng no sau ?y: ? C?ng c?. ? Bu?n nn v nn. ? L l?n. ? Y?u. ? M?t th? l?c ho?c nhn m?t tha?nh hai. ? Kh th?. ? Pht ban. ? Bu?n ng? b?t th??ng. ? S?t. ? Kh ni. ? ?au ? m?t ho?c tai. ? Kh ?i l?i ho?c kh gi? th?ng b?ng. ? C?m th?y l? ?i ho?c ng?t x?u. Tm t?t  ?au ??u do c?ng th?ng l c?m gic ?au, p l?c ho?c nh?c nh?i trong ??u th??ng c?m th?y ? vng trn v hai bn ??u.  M?t c?n ?au ??u do c?ng th?ng c th? ko di t? 30 pht ??n vi ngy. ?y l  d?ng ?au ??u th??ng g?p nh?t.  Tnh tr?ng ny c th? ???c ch?n ?on d?a vo cc tri?u ch?ng, b?nh s? cu?a quy? vi? v khm th?c th?.  Tnh tr?ng ny c th? ???c ?i?u tr? b?ng cch thay ??i l?i s?ng v b?ng cc lo?i thu?c gip gi?m nh? tri?u ch?ng. Thng tin ny khng nh?m m?c ?ch thay th? cho l?i khuyn m chuyn gia ch?m [place] s?c kh?e ni v?i qu v?. Hy b?o ??m qu v? ph?i th?o lu?n b?t k? v?n  ?? g m qu v? c v?i chuyn gia ch?m [place] s?c kh?e c?a qu v?. Document Released: 06/04/2005 Document Revised: 10/23/2016 Document Reviewed: 10/23/2016 Elsevier Patient Education  [place].   Please be sure medication list is accurate. If a new problem present, please set up appointment sooner than planned today.

## 2018-12-15 NOTE — Assessment & Plan Note (Signed)
?    Tension headache. Explained that Imitrex is to take as needed instead daily. Because she is having frequent headaches, recommend amitriptyline 10 mg at bedtime. We discussed side effects of both medications. Instructed about warning signs. Follow-up in 2 months with headache diary.

## 2019-02-16 ENCOUNTER — Other Ambulatory Visit: Payer: Self-pay

## 2019-02-16 ENCOUNTER — Encounter: Payer: Self-pay | Admitting: Family Medicine

## 2019-02-16 ENCOUNTER — Telehealth: Payer: Self-pay | Admitting: *Deleted

## 2019-02-16 ENCOUNTER — Ambulatory Visit (INDEPENDENT_AMBULATORY_CARE_PROVIDER_SITE_OTHER): Payer: BLUE CROSS/BLUE SHIELD | Admitting: Family Medicine

## 2019-02-16 VITALS — BP 98/72 | HR 84 | Temp 98.6°F | Resp 12 | Ht 62.0 in | Wt 109.0 lb

## 2019-02-16 DIAGNOSIS — F324 Major depressive disorder, single episode, in partial remission: Secondary | ICD-10-CM | POA: Diagnosis not present

## 2019-02-16 DIAGNOSIS — R51 Headache: Secondary | ICD-10-CM | POA: Diagnosis not present

## 2019-02-16 DIAGNOSIS — R519 Headache, unspecified: Secondary | ICD-10-CM

## 2019-02-16 DIAGNOSIS — R55 Syncope and collapse: Secondary | ICD-10-CM

## 2019-02-16 DIAGNOSIS — G4700 Insomnia, unspecified: Secondary | ICD-10-CM

## 2019-02-16 LAB — CBC
HCT: 33.7 % — ABNORMAL LOW (ref 36.0–46.0)
Hemoglobin: 10.2 g/dL — ABNORMAL LOW (ref 12.0–15.0)
MCHC: 30.1 g/dL (ref 30.0–36.0)
MCV: 86.1 fl (ref 78.0–100.0)
Platelets: 275 10*3/uL (ref 150.0–400.0)
RBC: 3.92 Mil/uL (ref 3.87–5.11)
RDW: 15.8 % — ABNORMAL HIGH (ref 11.5–15.5)
WBC: 5.9 10*3/uL (ref 4.0–10.5)

## 2019-02-16 LAB — BASIC METABOLIC PANEL
BUN: 9 mg/dL (ref 6–23)
CO2: 27 mEq/L (ref 19–32)
Calcium: 9 mg/dL (ref 8.4–10.5)
Chloride: 102 mEq/L (ref 96–112)
Creatinine, Ser: 0.66 mg/dL (ref 0.40–1.20)
GFR: 98.03 mL/min (ref >60.00)
Glucose, Bld: 45 mg/dL — CL (ref 70–99)
Potassium: 3.8 mEq/L (ref 3.5–5.1)
Sodium: 138 mEq/L (ref 135–145)

## 2019-02-16 LAB — TSH: TSH: 2.14 u[IU]/mL (ref 0.35–4.50)

## 2019-02-16 MED ORDER — SERTRALINE HCL 50 MG PO TABS: 50.0000 mg | ORAL_TABLET | Freq: Every day | ORAL | 1 refills | Status: AC

## 2019-02-16 MED ORDER — AMITRIPTYLINE HCL 10 MG PO TABS: 10.0000 mg | ORAL_TABLET | Freq: Every day | ORAL | 1 refills | Status: AC

## 2019-02-16 NOTE — Telephone Encounter (Signed)
She speak [place], so we need to let her know about lab results through a translator. I am not sure if she was fasting today.  Be sure not to skip meals, small portions of food with protein every 3 hours. Can a hemoglobin A1c be added to labs?  Thanks, BJ

## 2019-02-16 NOTE — Progress Notes (Addendum)
HPI:   PamelaPamela Anderson is a 43 y.o. female, who is here today to follow on recent OV.  A translator was present during the visit.  She was last seen on 12/15/2018, when she was started on amitriptyline 10 mg daily to help with sleep and frequent headaches.  Since her last OV she has been screened for COVID 19 and it was negative. She denies having any respiratory or GI symptoms.  She is sleeping better, she goes to bed around 10 pm and wakes up at 5 Am. She feels rested  She has had 2 "slight" headache since her last visit. She has not needed Imitrex.  She is complaining about an episode of dizziness about 2 weeks ago, "almost pass out." She was at the [facility] visiting her aunt's tomb. Episode lasted about 10 minutes.  She denies associated headache, visual changes, chest pain, dyspnea, palpitation, diaphoresis, nausea, vomiting, focal deficit, urinary/bowel incontinence. She has not had any other episode and denies prior history. LMP: She has IUD.  She is on Sertraline 50 mg daily, which is still helping with depression. She denies suicidal ideation or having a plan. Still having low appetite.  Depression screen [facility] 2/9 02/16/2019 12/15/2018 12/04/2017  Decreased Interest 3 0 0  Down, Depressed, Hopeless 1 1 0  PHQ - 2 Score 4 1 0  Altered sleeping 0 3 0  Tired, decreased energy 1 2 0  Change in appetite 1 0 0  Feeling bad or failure about yourself  1 0 0  Trouble concentrating 0 1 0  Moving slowly or fidgety/restless 0 0 0  Suicidal thoughts 0 0 0  PHQ-9 Score 7 7 0  Difficult doing work/chores Not difficult at all Not difficult at all -    Tolerating medication well.  Review of Systems  Constitutional: Positive for fatigue. Negative for activity change, diaphoresis and unexpected weight change.  HENT: Negative for mouth sores and sore throat.   Eyes: Negative for pain and redness.  Respiratory: Negative for cough and wheezing.   Gastrointestinal: Negative for  abdominal pain and blood in stool.       No changes in bowel movements.  Endocrine: Negative for cold intolerance and heat intolerance.  Genitourinary: Negative for decreased urine volume, dysuria and hematuria.  Musculoskeletal: Negative for gait problem and myalgias.  Skin: Negative for pallor and rash.  Neurological: Negative for syncope, facial asymmetry and speech difficulty.  Psychiatric/Behavioral: Negative for confusion and hallucinations.  Rest see pertinent positives and negatives per HPI.   Current Outpatient Medications on File Prior to Visit  Medication Sig Dispense Refill  . fluticasone (FLONASE) 50 MCG/ACT nasal spray Place 1-2 sprays into both nostrils daily. 16 g 2  . SUMAtriptan (IMITREX) 25 MG tablet Please take 1 tablet for headache.May repeat in 2 hours if headache persists or recurs. 10 tablet 0   No current facility-administered medications on file prior to visit.    Past Medical History:  Diagnosis Date  . Migraine    No Known Allergies  Social History   Socioeconomic History  . Marital status: Single    Spouse name: Not on file  . Number of children: 2  . Years of education: Not on file  . Highest education level: Not on file  Occupational History  . Not on file  Social Needs  . Financial resource strain: Not on file  . Food insecurity    Worry: Not on file    Inability: Not on file  .  Transportation needs    Medical: Not on file    Non-medical: Not on file  Tobacco Use  . Smoking status: Never Smoker  . Smokeless tobacco: Never Used  Substance and Sexual Activity  . Alcohol use: Never    Frequency: Never  . Drug use: Never  . Sexual activity: Not Currently    Birth control/protection: I.U.D.  Lifestyle  . Physical activity    Days per week: Not on file    Minutes per session: Not on file  . Stress: Not on file  Relationships  . Social Herbalist on phone: Not on file    Gets together: Not on file    Attends religious  service: Not on file    Active member of club or organization: Not on file    Attends meetings of clubs or organizations: Not on file    Relationship status: Not on file  Other Topics Concern  . Not on file  Social History Narrative  . Not on file    Vitals:   02/16/19 1010  BP: 98/72  Pulse: 84  Resp: 12  Temp: 98.6 F (37 C)  SpO2: 100%   Body mass index is 19.94 kg/m.  Physical Exam  Nursing note and vitals reviewed. Constitutional: She is oriented to person, place, and time. She appears well-developed. No distress.  HENT:  Head: Normocephalic and atraumatic.  Mouth/Throat: Oropharynx is clear and moist and mucous membranes are normal.  Eyes: Pupils are equal, round, and reactive to light. Conjunctivae are normal.  Neck: No thyromegaly present.  Cardiovascular: Normal rate and regular rhythm.  No murmur heard. Pulses:      Dorsalis pedis pulses are 2+ on the right side and 2+ on the left side.  Respiratory: Effort normal and breath sounds normal. No respiratory distress.  GI: Soft. She exhibits no mass. There is no hepatomegaly. There is no abdominal tenderness.  Musculoskeletal:        General: No edema.  Lymphadenopathy:    She has no cervical adenopathy.  Neurological: She is alert and oriented to person, place, and time. She has normal strength. No cranial nerve deficit. Gait normal.  Reflex Scores:      Patellar reflexes are 2+ on the right side and 2+ on the left side. Skin: Skin is warm. No rash noted. No erythema.  Psychiatric: She has a normal mood and affect.  Well groomed, good eye contact.    ASSESSMENT AND PLAN:  [name] was seen today for follow-up.  Diagnoses and all orders for this visit:  Lab Results  Component Value Date   TSH 2.14 02/16/2019   Lab Results  Component Value Date   WBC 5.9 02/16/2019   HGB 10.2 Repeated and verified X2. (L) 02/16/2019   HCT 33.7 (L) 02/16/2019   MCV 86.1 02/16/2019   PLT 275.0 02/16/2019   Lab  Results  Component Value Date   CREATININE 0.66 02/16/2019   BUN 9 02/16/2019   NA 138 02/16/2019   K 3.8 02/16/2019   CL 102 02/16/2019   CO2 27 02/16/2019    Insomnia, unspecified type Sleeping better. Reviewed side effects of Amitriptyline. No changes in current management. Good sleep hygiene.  -     amitriptyline (ELAVIL) 10 MG tablet; Take 1 tablet (10 mg total) by mouth at bedtime.  Headache, unspecified headache type Tension like headache vs migraine. Great improvement. Continue Amitriptyline 10 mg at bedtime. Instructed about warning signs.  -  amitriptyline (ELAVIL) 10 MG tablet; Take 1 tablet (10 mg total) by mouth at bedtime.  Depression, major, single episode, in partial remission (H[facility]) Improved. Continue Sertraline 50 mg and Amitriptyline 10 mg. Psychotherapy recommended. F/U in 3-4 months.  -     TSH -     sertraline (ZOLOFT) 50 MG tablet; Take 1 tablet (50 mg total) by mouth daily.  Pre-syncope One episode. Possible etiologies discussed. I do not think imaging or cardiac work up is necessary at this time. Adequate hydration. Instructed about warning signs.  -     Basic metabolic panel -     CBC -     TSH    Return in about 4 months (around 06/18/2019) for depression,HA.   Eleuterio Dollar G. [place], MD  [facility]. [place] office.

## 2019-02-16 NOTE — Patient Instructions (Addendum)
A few things to remember from today's visit:   Insomnia, unspecified type  Headache, unspecified headache type  Depression, major, single episode, in partial remission (HCC) - Plan: TSH  Pre-syncope - Plan: Basic metabolic panel, CBC, TSH  Chng m?t Dizziness Chng m?t l m?t v?n ?? ph? bi?n. ? l c?m gic khng ?n ??nh ho?c chong vng. Qu v? c th? c?m th?y nh? s?p ng?t. Chng m?t c th? d?n ??n ch?n th??ng n?u qu v? v?p ho?c t ng. B?t k? ai ??u c th? b? chng m?t, nh?ng chng m?t ph? bi?n h?n ? ng??i tr??ng thnh l?n tu?i. Nguyn nhn c?a tnh tr?ng ny c th? do m?t s? y?u t? nh? thu?c, m?t n??c, ho?c b?nh l. Tun th? nh?ng h??ng d?n ny ? nh: ?n v u?ng  U?ng ?? n??c ?? gi? cho n??c ti?u trong ho?c c mu vng nh?t. U?ng ?? n??c gip qu v? trnh b? m?t n??c. C? g?ng u?ng nhi?u cc ?? l?ng trong h?n, ch?ng h?n nh? n??c.  Khng u?ng r??u.  H?n ch? l??ng dng caffeine n?u ???c chuyn gia ch?m [place] s?c kh?e ch? d?n nh? v?y. Ki?m tra thnh ph?n v thng tin dinh d??ng ?? xem th?c ph?m ho?c ?? u?ng c ch?a caffeine khng.  H?n ch? l??ng mu?i (natri) n?u chuyn gia ch?m [place] s?c kh?e ch? d?n nh? v?y. Ki?m tra thnh ph?n v thng tin dinh d??ng ?? xem th?c ph?m ho?c ?? u?ng c ch?a natri khng. Ho?t ??ng  Trnh cc v?n ??ng nhanh. ? ??ng d?y kh?i gh? th?t ch?m v ?n ??nh cho ??n khi qu v? c?m th?y khng c v?n ?? g. ? Vo bu?i sng, ??u tin l ng?i d?y trn mp gi??ng. Khi qu v? c?m th?y ?n, hy t? t? ??ng d?y trong lc bm vo v?t g ? cho ??n khi qu v? th?y th?ng b?ng.  N?u qu v? c?n ??ng ? m?t n?i trong m?t th?i gian di, hy c? ??ng chn th??ng xuyn. Si?t ch?t v th? gin cc c? chn trong lc ??ng.  Khng li xe ho?c v?n hnh my mc n?ng n?u qu v? c?m th?y chng m?t.  Trnh g?p ng??i xu?ng n?u qu v? c?m th?y chng m?t. ??t cc ?? v?t trong nh sao cho d? v?i t?i m khng c?n ph?i nhoi ng??i. L?i s?ng  Khng s? d?ng b?t k? s?n ph?m no ch?a nicotine ho?c thu?c  l, ch?ng ha?n nh? thu?c l d?ng ht v thu?c l ?i?n t?. N?u qu v? c?n gip ?? ?? cai thu?c, hy h?i chuyn gia ch?m [place] s?c kh?e.  C? g?ng gi?m m?c ?? c?ng th?ng b?ng cch s? d?ng cc ph??ng php nh? yoga ho?c thi?n. Trao ??i v?i chuyn gia ch?m [place] s?c kh?e n?u qu v? c?n ???c tr? gip ?? qu?n l c?ng th?ng. H??ng d?n chung  Theo di tnh tr?ng chng m?t c?a mnh xem c b?t c? thay ??i no khng.  Ch? s? d?ng thu?c khng k ??n v thu?c k ??n theo ch? d?n c?a chuyn gia ch?m [place] s?c kh?e. Hy trao ??i v?i chuyn gia ch?m [place] s?c kh?e n?u qu v? ngh? r?ng chng m?t l do thu?c qu v? ?ang dng gy ra.  Hy ni cho b?n b ho?c thnh vin gia ?nh bi?t r?ng qu v? ?ang c?m th?y chng m?t. N?u ng??i ny th?y c b?t c? thay ??i no trong hnh vi c?a qu v?, hy nh? ng??i ny g?i cho chuyn gia ch?m [place] s?c  kh?e c?a qu v?.  Tun th? t?t c? cc l?n khm theo di theo ch? d?n c?a chuyn gia ch?m [place] s?c kh?e. ?i?u ny c vai tr quan tr?ng. Hy lin l?c v?i chuyn gia ch?m [place] s?c kh?e n?u:  Tnh tr?ng chng m?t c?a qu v? khng h?t.  Qu v? b? hoa m?t ho?c chng m?t tr?m tr?ng h?n.  Qu v? c?m th?y bu?n nn.  Thnh l?c c?a qu v? gi?m.  Qu v? c cc tri?u ch?ng m?i.  Qu v? ??ng khng v?ng ho?c c?m gic nh? c?n phng ?ang quay. Yu c?u tr? gip ngay l?p t?c n?u:  Qu v? nn ho?c b? tiu ch?y v khng th? ?n ho?c u?ng th? g.  Qu v? kh ni chuy?n, ?i b?, nu?t, ho?c kh s? d?ng cnh tay, bn tay ho?c chn.  Qu v? c?m th?y y?u ton thn.  Qu v? suy ngh? khng r rng ho?c kh ??t cu. M?t ng??i b?n ho?c ng??i trong gia ?nh c th? nh?n th?y ?i?u ny.  Qu v? b? ?au ng?c, ?au b?ng, kh th? ho?c v m? hi.  Th? l?c c?a qu v? thay ??i.  Qu v? b? ch?y mu.  Qu v? b? ?au ??u d? d?i.  Qu v? b? ?au c? ho?c c?ng c?.  Qu v? b? s?t. Nh?ng tri?u ch?ng ny c th? l bi?u hi?n c?a m?t v?n ?? nghim tr?ng c?n c?p c?u. Khng ch? xem tri?u ch?ng c h?t khng. Hy ?i khm ngay l?p  t?c. G?i cho d?ch v? c?p c?u t?i ??a ph??ng (911 ? [place] Khng t? li xe ??n b?nh vi?n. Tm t?t  Chng m?t l c?m gic khng ?n ??nh ho?c chong vng. Nguyn nhn c?a tnh tr?ng ny c th? do m?t s? y?u t? nh? thu?c, m?t n??c, ho?c b?nh l.  B?t k? ai ??u c th? b? chng m?t, nh?ng chng m?t ph? bi?n h?n ? ng??i tr??ng thnh l?n tu?i.  U?ng ?? n??c ?? gi? cho n??c ti?u trong ho?c c mu vng nh?t. Khng u?ng r??u.  Trnh nh?ng v?n ??ng nhanh n?u qu v? c?m th?y chng m?t. Theo di tnh tr?ng chng m?t c?a qu v? xem c thay ??i g khng. Thng tin ny khng nh?m m?c ?ch thay th? cho l?i khuyn m chuyn gia ch?m [place] s?c kh?e ni v?i qu v?. Hy b?o ??m qu v? ph?i th?o lu?n b?t k? v?n ?? g m qu v? c v?i chuyn gia ch?m [place] s?c kh?e c?a qu v?. Document Released: 05/24/2011 Document Revised: 09/28/2016 Document Reviewed: 09/28/2016 Elsevier Patient Education  [place]. Please be sure medication list is accurate. If a new problem present, please set up appointment sooner than planned today.

## 2019-02-16 NOTE — Telephone Encounter (Signed)
Lab at [facility] called to give critical lab for patient. Glucose is 45.

## 2019-02-16 NOTE — Telephone Encounter (Signed)
Left detailed message for patient with the help of the [place] interpreter. Patient advised to call office with any questions.

## 2019-02-17 NOTE — Telephone Encounter (Signed)
Patient informed and verbalized understanding

## 2019-03-10 ENCOUNTER — Other Ambulatory Visit: Payer: Self-pay | Admitting: *Deleted

## 2019-03-10 DIAGNOSIS — D649 Anemia, unspecified: Secondary | ICD-10-CM

## 2019-03-23 ENCOUNTER — Ambulatory Visit: Payer: BLUE CROSS/BLUE SHIELD | Admitting: Physician Assistant

## 2019-05-18 ENCOUNTER — Encounter: Payer: Self-pay | Admitting: Family Medicine

## 2019-05-18 ENCOUNTER — Other Ambulatory Visit: Payer: Self-pay

## 2019-05-18 ENCOUNTER — Ambulatory Visit (INDEPENDENT_AMBULATORY_CARE_PROVIDER_SITE_OTHER): Payer: BLUE CROSS/BLUE SHIELD | Admitting: Family Medicine

## 2019-05-18 VITALS — BP 120/80 | HR 94 | Temp 97.8°F | Resp 12 | Ht 62.0 in | Wt 106.0 lb

## 2019-05-18 DIAGNOSIS — R1030 Lower abdominal pain, unspecified: Secondary | ICD-10-CM | POA: Diagnosis not present

## 2019-05-18 DIAGNOSIS — Z23 Encounter for immunization: Secondary | ICD-10-CM | POA: Diagnosis not present

## 2019-05-18 DIAGNOSIS — R519 Headache, unspecified: Secondary | ICD-10-CM

## 2019-05-18 DIAGNOSIS — D509 Iron deficiency anemia, unspecified: Secondary | ICD-10-CM

## 2019-05-18 DIAGNOSIS — F324 Major depressive disorder, single episode, in partial remission: Secondary | ICD-10-CM

## 2019-05-18 DIAGNOSIS — E162 Hypoglycemia, unspecified: Secondary | ICD-10-CM | POA: Diagnosis not present

## 2019-05-18 DIAGNOSIS — K5900 Constipation, unspecified: Secondary | ICD-10-CM

## 2019-05-18 LAB — CBC WITH DIFFERENTIAL/PLATELET
Basophils Absolute: 0 10*3/uL (ref 0.0–0.1)
Basophils Relative: 0.4 % (ref 0.0–3.0)
Eosinophils Absolute: 0 10*3/uL (ref 0.0–0.7)
Eosinophils Relative: 0.7 % (ref 0.0–5.0)
HCT: 35 % — ABNORMAL LOW (ref 36.0–46.0)
Hemoglobin: 10.5 g/dL — ABNORMAL LOW (ref 12.0–15.0)
Lymphocytes Relative: 27.8 % (ref 12.0–46.0)
Lymphs Abs: 1.7 10*3/uL (ref 0.7–4.0)
MCHC: 30 g/dL (ref 30.0–36.0)
MCV: 86.2 fl (ref 78.0–100.0)
Monocytes Absolute: 0.3 10*3/uL (ref 0.1–1.0)
Monocytes Relative: 4.7 % (ref 3.0–12.0)
Neutro Abs: 3.9 10*3/uL (ref 1.4–7.7)
Neutrophils Relative %: 66.4 % (ref 43.0–77.0)
Platelets: 290 10*3/uL (ref 150.0–400.0)
RBC: 4.06 Mil/uL (ref 3.87–5.11)
RDW: 15.3 % (ref 11.5–15.5)
WBC: 5.9 10*3/uL (ref 4.0–10.5)

## 2019-05-18 LAB — HEMOGLOBIN A1C: Hgb A1c MFr Bld: 3.8 % — ABNORMAL LOW (ref 4.6–6.5)

## 2019-05-18 NOTE — Assessment & Plan Note (Signed)
Most likely this is a contributing factor to her abdominal pain. Increase fiber in fluid intake. MiraLAX daily as needed, if still having constipation she can add bisacodyl 5 mg daily. Instructed about warning signs.

## 2019-05-18 NOTE — Assessment & Plan Note (Signed)
She has no heavy menses, IUD in place. GI referral placed again, she is going to need a [place] translator to keep appointment information. Instructed about warning signs.

## 2019-05-18 NOTE — Assessment & Plan Note (Signed)
Recommend small meals/food intake every 3 hours. Avoid concentrated sweets. Instructed about warning signs.

## 2019-05-18 NOTE — Assessment & Plan Note (Signed)
Tension-like headache. Problem is well controlled. Continue amitriptyline 10 mg daily at bedtime.

## 2019-05-18 NOTE — Progress Notes (Signed)
HPI:   [name] is a 43 y.o. female, who is here today for chronic disease management. [place] is [place] there is a Nurse, learning disability present during the visit.  She was last seen on 02/16/2019. Headache and insomnia: Currently she is on amitriptyline 10 mg daily. Sleeping "very good", she feels rested in the morning when she first gets up.  She is also on sertraline 50 mg daily. She is tolerating medication well. Denies suicidal thoughts.  Headaches: Parietal throbbing headache. She is on Imitrex, she has not needed since her last visit. She is having occasional episodes and milder since she has been taking amitriptyline 10 mg daily. 1-2 headaches since her last visit. Headaches are associated with mild dizziness. Alleviated by sleep.  Episodes are exacerbated by lack of sleep.  Today she is complaining about 3+ years of "really bad" lower abdominal pain, intermittently, cramps. She has not identified exacerbation factors, mild improvement after defecation. She has had blood in the stool in the past, she has not had any since her last visit. She denies associated nausea, vomiting, urinary symptoms, vaginal bleeding, or vaginal discharge.  Having hard stools, every 1 to 2 days. + Straining. Last bowel movement was yesterday. She has not tried OTC medication.  She reported history of hemorrhoids, she has been treated with topical steroids in the past.   Lab Results  Component Value Date   WBC 5.9 02/16/2019   HGB 10.2 Repeated and verified X2. (L) 02/16/2019   HCT 33.7 (L) 02/16/2019   MCV 86.1 02/16/2019   PLT 275.0 02/16/2019  She was referred to GI on 03/10/2019. She states that she received a call with appointment information but she could not understand caller.  Hypoglycemia: She has not had other episodes of presyncope/syncope since her last visit. She states that she has not received results of labs done after her last visit. Glucose was low at 45.  Review  of Systems  Constitutional: Negative for activity change, appetite change, fatigue, fever and unexpected weight change.  HENT: Negative for mouth sores, nosebleeds and trouble swallowing.   Eyes: Negative for redness and visual disturbance.  Respiratory: Negative for cough, shortness of breath and wheezing.   Cardiovascular: Negative for chest pain, palpitations and leg swelling.  Gastrointestinal:       Negative for changes in bowel habits.  Genitourinary: Negative for decreased urine volume, dysuria and hematuria.  Neurological: Negative for syncope, facial asymmetry and weakness.  Hematological: Negative for adenopathy. Does not bruise/bleed easily.  Psychiatric/Behavioral: Negative for confusion. The patient is nervous/anxious.   Rest of ROS, see pertinent positives sand negatives in HPI   Current Outpatient Medications on File Prior to Visit  Medication Sig Dispense Refill  . amitriptyline (ELAVIL) 10 MG tablet Take 1 tablet (10 mg total) by mouth at bedtime. 90 tablet 1  . fluticasone (FLONASE) 50 MCG/ACT nasal spray Place 1-2 sprays into both nostrils daily. 16 g 2  . sertraline (ZOLOFT) 50 MG tablet Take 1 tablet (50 mg total) by mouth daily. 90 tablet 1  . SUMAtriptan (IMITREX) 25 MG tablet Please take 1 tablet for headache.May repeat in 2 hours if headache persists or recurs. 10 tablet 0   No current facility-administered medications on file prior to visit.      Past Medical History:  Diagnosis Date  . Migraine    No Known Allergies  Social History   Socioeconomic History  . Marital status: Single    Spouse name: Not on file  .  Number of children: 2  . Years of education: Not on file  . Highest education level: Not on file  Occupational History  . Not on file  Social Needs  . Financial resource strain: Not on file  . Food insecurity    Worry: Not on file    Inability: Not on file  . Transportation needs    Medical: Not on file    Non-medical: Not on file   Tobacco Use  . Smoking status: Never Smoker  . Smokeless tobacco: Never Used  Substance and Sexual Activity  . Alcohol use: Never    Frequency: Never  . Drug use: Never  . Sexual activity: Not Currently    Birth control/protection: I.U.D.  Lifestyle  . Physical activity    Days per week: Not on file    Minutes per session: Not on file  . Stress: Not on file  Relationships  . Social Herbalist on phone: Not on file    Gets together: Not on file    Attends religious service: Not on file    Active member of club or organization: Not on file    Attends meetings of clubs or organizations: Not on file    Relationship status: Not on file  Other Topics Concern  . Not on file  Social History Narrative  . Not on file    Vitals:   05/18/19 0856  BP: 120/80  Pulse: 94  Resp: 12  Temp: 97.8 F (36.6 C)  SpO2: 97%   Body mass index is 19.39 kg/m.   Physical Exam  Nursing note and vitals reviewed. Constitutional: She is oriented to person, place, and time. She appears well-developed and well-nourished. No distress.  HENT:  Head: Normocephalic and atraumatic.  Mouth/Throat: Oropharynx is clear and moist and mucous membranes are normal.  Eyes: Pupils are equal, round, and reactive to light. Conjunctivae are normal.  Cardiovascular: Normal rate and regular rhythm.  No murmur heard. Pulses:      Dorsalis pedis pulses are 2+ on the right side and 2+ on the left side.  Respiratory: Effort normal and breath sounds normal. No respiratory distress.  GI: Soft. She exhibits no mass. There is no hepatomegaly. There is abdominal tenderness in the right lower quadrant and suprapubic area.    Musculoskeletal:        General: No edema.  Lymphadenopathy:    She has no cervical adenopathy.  Neurological: She is alert and oriented to person, place, and time. She has normal strength. No cranial nerve deficit. Gait normal.  Skin: Skin is warm. No rash noted. No erythema.   Psychiatric: She has a normal mood and affect.  Well groomed, good eye contact.   ASSESSMENT AND PLAN:   Ms. Pamela Anderson was seen today for chronic disease management.  Orders Placed This Encounter  Procedures  . US PELVIC COMPLETE WITH TRANSVAGINAL  . Flu Vaccine QUAD 36+ mos IM  . Hemoglobin A1c  . CBC with Differential/Platelet  . Ambulatory referral to Gastroenterology   Lab Results  Component Value Date   WBC 5.9 05/18/2019   HGB 10.5 (L) 05/18/2019   HCT 35.0 (L) 05/18/2019   MCV 86.2 05/18/2019   PLT 290.0 05/18/2019   Lab Results  Component Value Date   HGBA1C 3.8 Repeated and verified X2. (L) 05/18/2019    Constipation Most likely this is a contributing factor to her abdominal pain. Increase fiber in fluid intake. MiraLAX daily as needed, if still having constipation  she can add bisacodyl 5 mg daily. Instructed about warning signs.  Depression, major, single episode, in partial remission (HCC) Problem is well controlled with sertraline 50 mg daily. No changes in current management. Follow-up in 6 months.  Iron deficiency anemia She has no heavy menses, IUD in place. GI referral placed again, she is going to need a [place] translator to keep appointment information. Instructed about warning signs.  Headache, unspecified headache type Tension-like headache. Problem is well controlled. Continue amitriptyline 10 mg daily at bedtime.  Abdominal pain, lower Chronic. We discussed possible etiologies, including gynecologic etiology and constipation. Pelvic/transvaginal US will be arranged. Recommend arranging appointment with gynecologist, IUD is to be removed/replaced next year. Clearly instructed about warning signs.  Hypoglycemia Recommend small meals/food intake every 3 hours. Avoid concentrated sweets. Instructed about warning signs.  Need for immunization against influenza - Flu Vaccine QUAD 36+ mos IM   Return in about 6 months (around  11/15/2019) for Insomnia, headache, depression, constipation.   -Ms. Pamela Anderson was advised to return sooner than planned today if new concerns arise.    Aayden Cefalu G. [place], MD  [facility]. [place] office.

## 2019-05-18 NOTE — Assessment & Plan Note (Signed)
Problem is well controlled with sertraline 50 mg daily. No changes in current management. Follow-up in 6 months.

## 2019-05-18 NOTE — Patient Instructions (Addendum)
A few things to remember from today's visit:   Headache, unspecified headache type  Depression, major, single episode, in partial remission ([place])  Hypoglycemia - Plan: Hemoglobin A1c  Abdominal pain, lower - Plan: US PELVIC COMPLETE WITH TRANSVAGINAL, CBC with Differential/Platelet, Ambulatory referral to Gastroenterology  Iron deficiency anemia, unspecified iron deficiency anemia type - Plan: CBC with Differential/Platelet, Ambulatory referral to Gastroenterology  Constipation, unspecified constipation type - Plan: Ambulatory referral to Gastroenterology  Miralax daily as needed and can add Bisacodyl 5 mg at night as needed if still constipated with just Miralax.   To bn, Ng??i l?n Constipation, Adult To bn l khi m?t ng??i ?i ??i ti?n trong m?t tu?n t h?n so v?i bnh th??ng, ??i ti?n kh kh?n, ho?c ??i ti?n ra phn kh, c?ng, ho?c to h?n bnh th??ng. To bn c th? do m?t tnh tr?ng bn trong gy ra. N c th? tr? ln tr?m tr?ng h?n theo ?? tu?i n?u m?t ng??i dng cc lo?i thu?c nh?t ??nh v khng u?ng ?? n??c. Tun th? nh?ng h??ng d?n ny ? nh: ?n v u?ng   ?n th?c ?n c nhi?u ch?t x? nh? tri cy t??i v rau, ng? c?c nguyn h?t v cc lo?i ??u.  H?n ch? th?c ?n c nhi?u ch?t bo, t ch?t x?, ho?c ch? bi?n s?n qu m?c, ch?ng h?n khoai ty chin, bnh hamburger, bnh quy, k?o v soda.  U?ng ?? n??c ?? gi? cho n??c ti?u trong ho?c c mu vng nh?t. H??ng d?n chung  T?p th? d?c th??ng xuyn theo ch? d?n c?a chuyn gia ch?m [place] s?c kh?e.  ?i v? sinh ngay khi qu v? c nhu c?u. Khng nh?n ?i ??i ti?n.  Ch? s? d?ng thu?c khng k ??n v thu?c k ??n theo ch? d?n c?a chuyn gia ch?m [place] s?c kh?e. Cc thu?c ny bao g?m b?t k? th?c ph?m ch?c n?ng c ch?t x? no.  Th?c hnh cc bi t?p luy?n l?i c? ?y ch?u, ch?ng h?n nh? th? su trong lc th? gin b?ng d??i v th? gin ph?n ?y ch?u trong khi ??i ti?n.  Theo di tnh tr?ng c?a qu v? ?? pht hi?n b?t k? thay ??i no.  Tun  th? t?t c? cc l?n khm theo di theo ch? d?n c?a chuyn gia ch?m [place] s?c kh?e. ?i?u ny c vai tr quan tr?ng. Hy lin l?c v?i chuyn gia ch?m [place] s?c kh?e n?u:  Qu v? b? ?au tr?m tr?ng h?n.  Qu v? b? s?t.  Qu v? khng ?i ??i ti?n sau 4 ngy.  Qu v? nn.  Qu v? khng ?i.  Qu v? b? s?t cn.  Qu v? b? ch?y mu ? h?u mn.  Phn c?a qu v? m?ng, trng nh? bt ch. Yu c?u tr? gip ngay l?p t?c n?u:  Qu v? b? s?t v cc tri?u ch?ng c?a qu v? ??t nhin tr?m tr?ng h?n.  Qu v? sn phn ho?c c mu trong phn.  B?ng c?a qu v? b? ch??ng ln.  Qu v? b? ?au r?t nhi?u ? b?ng.  Qu v? c?m th?y chng m?t ho?c ng?t x?u. Thng tin ny khng nh?m m?c ?ch thay th? cho l?i khuyn m chuyn gia ch?m [place] s?c kh?e ni v?i qu v?. Hy b?o ??m qu v? ph?i th?o lu?n b?t k? v?n ?? g m qu v? c v?i chuyn gia ch?m [place] s?c kh?e c?a qu v?. Document Released: 09/19/2010 Document Revised: 09/17/2016 Document Reviewed: 11/23/2015 Elsevier Patient Education  2020 [employer].  Please be sure medication list is accurate. If a new problem present, please set up appointment sooner than planned today.

## 2019-05-18 NOTE — Assessment & Plan Note (Signed)
Chronic. We discussed possible etiologies, including gynecologic etiology and constipation. Pelvic/transvaginal US will be arranged. Recommend arranging appointment with gynecologist, IUD is to be removed/replaced next year. Clearly instructed about warning signs.

## 2019-05-21 MED ORDER — ACCU-CHEK SOFTCLIX LANCET DEV KIT: PACK | 2 refills | Status: AC

## 2019-06-01 ENCOUNTER — Telehealth: Payer: Self-pay | Admitting: *Deleted

## 2019-06-01 NOTE — Addendum Note (Signed)
Addended by: Nathanial Millman E on: 06/01/2019 02:04 PM   Modules accepted: Orders

## 2019-06-01 NOTE — Telephone Encounter (Signed)
Hello!  I went in and changed the order to external. Pt needs this done at a [facility] facility due to her insurance.

## 2019-06-01 NOTE — Telephone Encounter (Signed)
Copied from [place] [phone]. Topic: Referral - Status >> May 28, 2019 10:11 AM Burchel, Abbi R wrote: Pt has a referral in for an abd u/s from [facility], but she has [employer] so it has to be with a [place] facility.  Please note, future communication for this pt needs to be with [place] bc pt does not speak English: phone number: [phone]

## 2019-09-01 ENCOUNTER — Encounter: Payer: Self-pay | Admitting: Family Medicine

## 2019-09-12 ENCOUNTER — Ambulatory Visit: Payer: BLUE CROSS/BLUE SHIELD | Attending: Internal Medicine

## 2019-09-12 DIAGNOSIS — Z23 Encounter for immunization: Secondary | ICD-10-CM

## 2019-09-12 NOTE — Progress Notes (Signed)
   Covid-19 Vaccination Clinic  Name:  Gisselle Galvis    MRN: [id number] DOB: 1975/11/04  09/12/2019  Ms. Depner was observed post Covid-19 immunization for 15 minutes without incident. She was provided with Vaccine Information Sheet and instruction to access the V-Safe system.   Ms. Kenedy was instructed to call 911 with any severe reactions post vaccine: Marland Kitchen Difficulty breathing  . Swelling of face and throat  . A fast heartbeat  . A bad rash all over body  . Dizziness and weakness

## 2019-11-17 ENCOUNTER — Ambulatory Visit: Payer: BLUE CROSS/BLUE SHIELD | Admitting: Family Medicine

## 2019-11-27 ENCOUNTER — Ambulatory Visit: Payer: BLUE CROSS/BLUE SHIELD | Admitting: Family Medicine
